# Patient Record
Sex: Male | Born: 1950 | Hispanic: No | Marital: Married | Smoking: Former smoker
Health system: Southern US, Community
[De-identification: ages and names within clinical notes are randomized; demographics above are authoritative.]

## PROBLEM LIST (undated history)

## (undated) DIAGNOSIS — I10 Essential (primary) hypertension: Secondary | ICD-10-CM

## (undated) DIAGNOSIS — E785 Hyperlipidemia, unspecified: Secondary | ICD-10-CM

## (undated) DIAGNOSIS — I639 Cerebral infarction, unspecified: Secondary | ICD-10-CM

## (undated) HISTORY — DX: Cerebral infarction, unspecified: I63.9

---

## 2002-03-31 ENCOUNTER — Emergency Department (HOSPITAL_COMMUNITY): Admission: EM | Admit: 2002-03-31 | Discharge: 2002-03-31 | Payer: Self-pay | Admitting: Emergency Medicine

## 2002-03-31 ENCOUNTER — Encounter: Payer: Self-pay | Admitting: Emergency Medicine

## 2005-07-09 ENCOUNTER — Emergency Department (HOSPITAL_COMMUNITY): Admission: EM | Admit: 2005-07-09 | Discharge: 2005-07-10 | Payer: Self-pay | Admitting: Emergency Medicine
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
## 2018-03-19 ENCOUNTER — Observation Stay (HOSPITAL_BASED_OUTPATIENT_CLINIC_OR_DEPARTMENT_OTHER)
Admission: EM | Admit: 2018-03-19 | Discharge: 2018-03-20 | Disposition: A | Discharge status: Home / Self Care | Payer: Medicare Other | Attending: Internal Medicine | Admitting: Internal Medicine

## 2018-03-19 ENCOUNTER — Other Ambulatory Visit: Payer: Self-pay

## 2018-03-19 ENCOUNTER — Emergency Department (HOSPITAL_BASED_OUTPATIENT_CLINIC_OR_DEPARTMENT_OTHER): Payer: Medicare Other

## 2018-03-19 ENCOUNTER — Encounter (HOSPITAL_BASED_OUTPATIENT_CLINIC_OR_DEPARTMENT_OTHER): Payer: Self-pay | Admitting: *Deleted

## 2018-03-19 DIAGNOSIS — I10 Essential (primary) hypertension: Secondary | ICD-10-CM | POA: Diagnosis present

## 2018-03-19 DIAGNOSIS — Z79899 Other long term (current) drug therapy: Secondary | ICD-10-CM | POA: Insufficient documentation

## 2018-03-19 DIAGNOSIS — Z7901 Long term (current) use of anticoagulants: Secondary | ICD-10-CM | POA: Insufficient documentation

## 2018-03-19 DIAGNOSIS — Z9119 Patient's noncompliance with other medical treatment and regimen: Secondary | ICD-10-CM | POA: Insufficient documentation

## 2018-03-19 DIAGNOSIS — Z7982 Long term (current) use of aspirin: Secondary | ICD-10-CM | POA: Insufficient documentation

## 2018-03-19 DIAGNOSIS — G459 Transient cerebral ischemic attack, unspecified: Principal | ICD-10-CM | POA: Diagnosis present

## 2018-03-19 DIAGNOSIS — E785 Hyperlipidemia, unspecified: Secondary | ICD-10-CM | POA: Diagnosis not present

## 2018-03-19 DIAGNOSIS — R200 Anesthesia of skin: Secondary | ICD-10-CM

## 2018-03-19 DIAGNOSIS — F17200 Nicotine dependence, unspecified, uncomplicated: Secondary | ICD-10-CM

## 2018-03-19 DIAGNOSIS — IMO0001 Reserved for inherently not codable concepts without codable children: Secondary | ICD-10-CM | POA: Diagnosis present

## 2018-03-19 HISTORY — DX: Hyperlipidemia, unspecified: E78.5

## 2018-03-19 HISTORY — DX: Essential (primary) hypertension: I10

## 2018-03-19 LAB — DIFFERENTIAL
Abs Immature Granulocytes: 0.07 10*3/uL (ref 0.00–0.07)
BASOS PCT: 1 %
Basophils Absolute: 0.1 10*3/uL (ref 0.0–0.1)
Eosinophils Absolute: 0.2 10*3/uL (ref 0.0–0.5)
Eosinophils Relative: 2 %
Immature Granulocytes: 1 %
Lymphocytes Relative: 36 %
Lymphs Abs: 2.7 10*3/uL (ref 0.7–4.0)
Monocytes Absolute: 0.6 10*3/uL (ref 0.1–1.0)
Monocytes Relative: 7 %
NEUTROS PCT: 53 %
Neutro Abs: 4 10*3/uL (ref 1.7–7.7)

## 2018-03-19 LAB — COMPREHENSIVE METABOLIC PANEL
ALT: 32 U/L (ref 0–44)
AST: 24 U/L (ref 15–41)
Albumin: 4.3 g/dL (ref 3.5–5.0)
Alkaline Phosphatase: 80 U/L (ref 38–126)
Anion gap: 6 (ref 5–15)
BUN: 13 mg/dL (ref 8–23)
CO2: 28 mmol/L (ref 22–32)
Calcium: 9 mg/dL (ref 8.9–10.3)
Chloride: 107 mmol/L (ref 98–111)
Creatinine, Ser: 0.9 mg/dL (ref 0.61–1.24)
GFR calc Af Amer: >60 mL/min (ref >60)
GFR calc non Af Amer: >60 mL/min (ref >60)
Glucose, Bld: 127 mg/dL — ABNORMAL HIGH (ref 70–99)
Potassium: 4.9 mmol/L (ref 3.5–5.1)
Sodium: 141 mmol/L (ref 135–145)
Total Bilirubin: 0.4 mg/dL (ref 0.3–1.2)
Total Protein: 7.4 g/dL (ref 6.5–8.1)

## 2018-03-19 LAB — RAPID URINE DRUG SCREEN, HOSP PERFORMED
Amphetamines: NOT DETECTED
Barbiturates: NOT DETECTED
Benzodiazepines: NOT DETECTED
Cocaine: NOT DETECTED
Opiates: NOT DETECTED
Tetrahydrocannabinol: NOT DETECTED

## 2018-03-19 LAB — APTT: APTT: 28 s (ref 24–36)

## 2018-03-19 LAB — URINALYSIS, ROUTINE W REFLEX MICROSCOPIC
Bilirubin Urine: NEGATIVE
Glucose, UA: NEGATIVE mg/dL
Hgb urine dipstick: NEGATIVE
Ketones, ur: NEGATIVE mg/dL
Leukocytes, UA: NEGATIVE
NITRITE: NEGATIVE
PH: 6.5 (ref 5.0–8.0)
Protein, ur: NEGATIVE mg/dL
Specific Gravity, Urine: 1.02 (ref 1.005–1.030)

## 2018-03-19 LAB — CBC
HCT: 48.9 % (ref 39.0–52.0)
Hemoglobin: 15.9 g/dL (ref 13.0–17.0)
MCH: 31.5 pg (ref 26.0–34.0)
MCHC: 32.5 g/dL (ref 30.0–36.0)
MCV: 96.8 fL (ref 80.0–100.0)
Platelets: 241 10*3/uL (ref 150–400)
RBC: 5.05 MIL/uL (ref 4.22–5.81)
RDW: 12.8 % (ref 11.5–15.5)
WBC: 7.5 10*3/uL (ref 4.0–10.5)
nRBC: 0 % (ref 0.0–0.2)

## 2018-03-19 LAB — ETHANOL: Alcohol, Ethyl (B): <10 mg/dL (ref <10)

## 2018-03-19 LAB — PROTIME-INR
INR: 0.93
Prothrombin Time: 12.4 seconds (ref 11.4–15.2)

## 2018-03-19 LAB — TROPONIN I: Troponin I: <0.03 ng/mL (ref <0.03)

## 2018-03-19 MED ORDER — ACETAMINOPHEN 160 MG/5ML PO SOLN: 650.0000 mg | ORAL | Status: DC | PRN

## 2018-03-19 MED ORDER — ASPIRIN 81 MG PO CHEW
324.0000 mg | CHEWABLE_TABLET | Freq: Once | ORAL | Status: AC
  Administered 2018-03-19: 324 mg via ORAL
  Filled 2018-03-19: qty 4

## 2018-03-19 MED ORDER — ASPIRIN 300 MG RE SUPP: 300.0000 mg | Freq: Every day | RECTAL | Status: DC

## 2018-03-19 MED ORDER — ENOXAPARIN SODIUM 40 MG/0.4ML ~~LOC~~ SOLN
40.0000 mg | SUBCUTANEOUS | Status: DC
  Administered 2018-03-20: 40 mg via SUBCUTANEOUS
  Filled 2018-03-19: qty 0.4

## 2018-03-19 MED ORDER — ACETAMINOPHEN 325 MG PO TABS: 650.0000 mg | ORAL_TABLET | ORAL | Status: DC | PRN

## 2018-03-19 MED ORDER — STROKE: EARLY STAGES OF RECOVERY BOOK
Freq: Once | Status: AC
  Administered 2018-03-20: 01:00:00
  Filled 2018-03-19: qty 1

## 2018-03-19 MED ORDER — ACETAMINOPHEN 650 MG RE SUPP: 650.0000 mg | RECTAL | Status: DC | PRN

## 2018-03-19 MED ORDER — ASPIRIN 325 MG PO TABS
325.0000 mg | ORAL_TABLET | Freq: Every day | ORAL | Status: DC
  Administered 2018-03-20: 325 mg via ORAL
  Filled 2018-03-19: qty 1

## 2018-03-19 NOTE — H&P (Addendum)
History and Physical    Jordan Bradford VJK:[id number] DOB: 10-12-1950 DOA: 03/19/2018  PCP: Jordan Bradford  Patient coming from: Home  I have personally briefly reviewed patient's old medical records in [facility]  Chief Complaint: L arm and face numbness  HPI: Jordan Bradford is a 68 y.o. male with medical history significant of HTN, HLD, smoking, isnt really compliant with medication Bradford daughter.  Patient presents to ED at [facility] with onset at ~10pm last night of L sided facial, LUE, and LLE numbness.  Symptoms persistent since that time.  No prior h/o Stroke/TIA.  No recent head injury.  Patient speaks some english, family at bedside translating.   ED Course: CT head neg.  Tele-neuro consult recd admission for stroke work up.   Review of Systems: As Bradford HPI otherwise 10 point review of systems negative.   Past Medical History:  Diagnosis Date  . HLD (hyperlipidemia)   . Hypertension     History reviewed. No pertinent surgical history.   reports that he has been smoking. He has never used smokeless tobacco. He reports that he does not drink alcohol or use drugs.  No Known Allergies  Family History  Problem Relation Age of Onset  . Stroke Neg Hx      Prior to Admission medications   Not on File    Physical Exam: Vitals:   03/19/18 2030 03/19/18 2100 03/19/18 2130 03/19/18 2224  BP: (!) 186/104 (!) 190/110 (!) 196/106 (!) 192/107  Pulse: 82 81 82 80  Resp: 17 13 17 18   Temp:    98.4 F (36.9 C)  TempSrc:    Oral  SpO2: 99% 99% 98% 98%  Weight:      Height:        Constitutional: NAD, calm, comfortable Eyes: PERRL, lids and conjunctivae normal ENMT: Mucous membranes are moist. Posterior pharynx clear of any exudate or lesions.Normal dentition.  Neck: normal, supple, no masses, no thyromegaly Respiratory: clear to auscultation bilaterally, no wheezing, no crackles. Normal respiratory effort. No accessory muscle use.  Cardiovascular: Regular rate and  rhythm, no murmurs / rubs / gallops. No extremity edema. 2+ pedal pulses. No carotid bruits.  Abdomen: no tenderness, no masses palpated. No hepatosplenomegaly. Bowel sounds positive.  Musculoskeletal: no clubbing / cyanosis. No joint deformity upper and lower extremities. Good ROM, no contractures. Normal muscle tone.  Skin: no rashes, lesions, ulcers. No induration Neurologic: Minimal L sided pronator drift, reports abnormal sensation to L side. Psychiatric: Normal judgment and insight. Alert and oriented x 3. Normal mood.    Labs on Admission: I have personally reviewed following labs and imaging studies  CBC: Recent Labs  Lab 03/19/18 1709  WBC 7.5  NEUTROABS 4.0  HGB 15.9  HCT 48.9  MCV 96.8  PLT 241   Basic Metabolic Panel: Recent Labs  Lab 03/19/18 1709  NA 141  K 4.9  CL 107  CO2 28  GLUCOSE 127*  BUN 13  CREATININE 0.90  CALCIUM 9.0   GFR: Estimated Creatinine Clearance: 75.2 mL/min (by C-G formula based on SCr of 0.9 mg/dL). Liver Function Tests: Recent Labs  Lab 03/19/18 1709  AST 24  ALT 32  ALKPHOS 80  BILITOT 0.4  PROT 7.4  ALBUMIN 4.3   No results for input(s): LIPASE, AMYLASE in the last 168 hours. No results for input(s): AMMONIA in the last 168 hours. Coagulation Profile: Recent Labs  Lab 03/19/18 1709  INR 0.93   Cardiac Enzymes: Recent Labs  Lab  03/19/18 1709  TROPONINI <0.03   BNP (last 3 results) No results for input(s): PROBNP in the last 8760 hours. HbA1C: No results for input(s): HGBA1C in the last 72 hours. CBG: No results for input(s): GLUCAP in the last 168 hours. Lipid Profile: No results for input(s): CHOL, HDL, LDLCALC, TRIG, CHOLHDL, LDLDIRECT in the last 72 hours. Thyroid Function Tests: No results for input(s): TSH, T4TOTAL, FREET4, T3FREE, THYROIDAB in the last 72 hours. Anemia Panel: No results for input(s): VITAMINB12, FOLATE, FERRITIN, TIBC, IRON, RETICCTPCT in the last 72 hours. Urine analysis:      Component Value Date/Time   COLORURINE YELLOW 03/19/2018 1709   APPEARANCEUR CLEAR 03/19/2018 1709   LABSPEC 1.020 03/19/2018 1709   PHURINE 6.5 03/19/2018 1709   GLUCOSEU NEGATIVE 03/19/2018 1709   HGBUR NEGATIVE 03/19/2018 1709   BILIRUBINUR NEGATIVE 03/19/2018 1709   KETONESUR NEGATIVE 03/19/2018 1709   PROTEINUR NEGATIVE 03/19/2018 1709   NITRITE NEGATIVE 03/19/2018 1709   LEUKOCYTESUR NEGATIVE 03/19/2018 1709    Radiological Exams on Admission: Ct Head Wo Contrast  Result Date: 03/19/2018 CLINICAL DATA:  Numbness and tingling on the left which is chronic but worsening. EXAM: CT HEAD WITHOUT CONTRAST TECHNIQUE: Contiguous axial images were obtained from the base of the skull through the vertex without intravenous contrast. COMPARISON:  None. FINDINGS: Brain: The brain shows a normal appearance without evidence of malformation, atrophy, old or acute small or large vessel infarction, mass lesion, hemorrhage, hydrocephalus or extra-axial collection. Vascular: There is atherosclerotic calcification of the major vessels at the base of the brain. Skull: Normal.  No traumatic finding.  No focal bone lesion. Sinuses/Orbits: Sinuses are clear. Orbits appear normal. Mastoids are clear. Other: None significant IMPRESSION: Normal exam for age. Normal appearance of the brain itself. Some atherosclerotic calcification of the major vessels at the base of the brain. Electronically Signed   By: Paulina FusiMark  Shogry M.D.   On: 03/19/2018 17:17    EKG: Independently reviewed.  Assessment/Plan Principal Problem:   TIA (transient ischemic attack) Active Problems:   HTN (hypertension)   HLD (hyperlipidemia)   Smoking    1. TIA - 1. Stroke pathway and work up 2. MRI / MRA 3. 2d echo 4. Carotid dopplers 5. FLP / A1C 6. ASA 325 daily for now 7. PT/OT/SLP 8. Neuro consult if MRI positive for stroke. 2. HTN - 1. Not currently on any meds at home 2. MRI pending 1. if neg then would start treatment with  HTN meds 2. if positive then needs permissive HTN 3. HLD - 1. Not currently on any home meds 2. FLP in AM 4. Smoking - 1. Advised to quit 2. Doesn't want pharm therapy to help at the moment Bradford family, though they are going to discuss this further with him after hospital stay. 5. Report of intermittent CP - no CP today 1. EKG noted to have TWI in III, AVF, and V6 2. Trop neg and asymptomatic from CP standpoint today 3. Likely needs cards f/u outpt as I discussed with family.  Doubt they would do inpatient intervention right now given neg trop, asymptomatic patient. 4. But definitely needs medical therapy / risk factor modification of HTN, HLD, and smoking, regardless.  DVT prophylaxis: Lovenox Code Status: Full Family Communication: Family at bedside, daughter seems very medically oriented Disposition Plan: Home after admit Consults called: None Admission status: Place in 43obs - convert to IP if stroke confirmed    Hillary BowGARDNER, JARED M. DO [facility] Pager [phone] Only works nights!  If 7AM-7PM, please contact the primary day team physician taking care of patient  [email or web address] Password [email or web address]  03/19/2018, 11:24 PM

## 2018-03-19 NOTE — ED Notes (Signed)
[facility] notified (Kim) - Patient ready for transport 

## 2018-03-19 NOTE — ED Triage Notes (Signed)
Numbness to his left face, arm and leg since last night.

## 2018-03-19 NOTE — ED Notes (Signed)
Teleneuro at the bedside. 

## 2018-03-19 NOTE — Progress Notes (Signed)
Pt arrived to the unit via stretcher by [facility]. Pt alert and verbally responsive; pt in bed with family at bedside; MD notified of pt's arrival to the unit. Pt oriented to the unit and room; call light within reach. Dionne Bucy RN

## 2018-03-19 NOTE — Consult Note (Addendum)
TeleNeurology Consult Note  Edythe Clarity     Consulting Physician: Sharalyn Ink Code Status: No Order Primary Care Physician:  No primary care provider on file.  DOB:  1950-03-10   Age: 68 y.o.  Date of Service: March 19, 2018 Admit Date:  03/19/2018   Admitting Diagnosis: <principal problem not specified>  Subjective:  Reason for Consultation: numbness since 2200  Chief Complaint: numbness since 2200  History of present illness: 68 y/o man with h/o HTN and hyperlipdemia who presents with left sided numbness since last night at 2200. CT head and reviewed. Patient's nephew at the bedside. STAT teleneurology consult requested. Case discussed with ED staff. Patient's nephew assisted in translating. All questions answered.   Review of Systems  There are no active problems to display for this patient.  Past Medical History:  Diagnosis Date  . Hypertension    History reviewed. No pertinent surgical history. No Known Allergies  Social History   Socioeconomic History  . Marital status: Married    Spouse name: Not on file  . Number of children: Not on file  . Years of education: Not on file  . Highest education level: Not on file  Occupational History  . Not on file  Social Needs  . Financial resource strain: Not on file  . Food insecurity:    Worry: Not on file    Inability: Not on file  . Transportation needs:    Medical: Not on file    Non-medical: Not on file  Tobacco Use  . Smoking status: Former Games developer  . Smokeless tobacco: Never Used  Substance and Sexual Activity  . Alcohol use: Never    Frequency: Never  . Drug use: Never  . Sexual activity: Not on file  Lifestyle  . Physical activity:    Days per week: Not on file    Minutes per session: Not on file  . Stress: Not on file  Relationships  . Social connections:    Talks on phone: Not on file    Gets together: Not on file    Attends religious service: Not on file    Active member of club or  organization: Not on file    Attends meetings of clubs or organizations: Not on file    Relationship status: Not on file  . Intimate partner violence:    Fear of current or ex partner: Not on file    Emotionally abused: Not on file    Physically abused: Not on file    Forced sexual activity: Not on file  Other Topics Concern  . Not on file  Social History Narrative  . Not on file   Family History History reviewed. No pertinent family history.    Objective:  Vital signs in last 24 hours: Temp:  [98.1 F (36.7 C)] 98.1 F (36.7 C) (01/24 1636) Pulse Rate:  [83-96] 83 (01/24 1730) Resp:  [14-21] 21 (01/24 1730) BP: (192-212)/(99-111) 192/110 (01/24 1730) SpO2:  [98 %-99 %] 98 % (01/24 1730) Weight:  [72.6 kg] 72.6 kg (01/24 1634) Temp (24hrs), Avg:98.1 F (36.7 C), Min:98.1 F (36.7 C), Max:98.1 F (36.7 C)    Intake/Output last 3 shifts: No intake/output data recorded.  Intake/Output this shift: No intake/output data recorded.  Physical Exam 1a- LOC: Keenly responsive - 0      1b- LOC questions: Answers both questions correctly - 0     1c- LOC commands- Performs both tasks correctly- 0     2- Gaze: Normal; no  gaze paresis or gaze deviation - 0     3- Visual Fields: normal, no Visual field deficit - 0     4- Facial movements: no facial palsy - 0     5a- Right Upper limb motor - no drift -0     5b -Left Upper limb motor - no drift -0     6a- Right Lower limb motor - no drift - 0      6b- Left Lower limb motor - no drift - 0 7- Limb Coordination: absent ataxia - 0      8- Sensory : no sensory loss - 0      9- Language - No aphasia - 0      10- Speech - No dysarthria -0     11- Neglect / Extinction - none found -0     NIHSS score   0    Diagnostic Findings:  Pertinent Labs:  Recent Labs  Lab 03/19/18 1709  WBC 7.5  MCV 96.8   Recent Labs  Lab 03/19/18 1709  CALCIUM 9.0  BUN 13  GLUCOSE 127*  CREATININE 0.90  CO2 28   Recent Labs  Lab  03/19/18 1709  AST 24  ALT 32  ALBUMIN 4.3   No results for input(s): TRIG in the last 168 hours.  Invalid input(s): CHLPL, HDL PML, VLDL CALC, LDL CALC, CHOL/HDL RATIO, LDL/HDL RATIO, NON-HDL CHOLESTEROL Invalid input(s): CK TOTAL, TROPONIN I, CK MB Recent Labs  Lab 03/19/18 1709  APTT 28   Recent Labs  Lab 03/19/18 1709  INR 0.93    Extensive review of previous medical records completed.    Assessment: There are no active problems to display for this patient.    Plan: [facility] TeleNeurology Consult Services  Impression: TIA - r/o Stroke     Differential Diagnosis:  1. Cardioembolic stroke  2. Small vessel disease/lacune    3. Thromboembolic, artery-to-artery mechanism 4. Hypercoagulable state-related infarct  5. Thrombotic mechanism, large artery disease 6. Transient ischemic attack  Comments: Last known well:   2200 Door time:1640 [facility] contacted:   1804 [facility] at bedside:   STAT NIHSS assessment time:1825     Discussion:     Our recommendations are outlined below.  Recommendations: ASA, IV fluids and permissive hypertension (Keep SBP < 220/110) Neurochecks DVT prophylaxis    Follow up with Neurology for further testing and evaluation  Medical Decision Making:  - Extensive number of diagnosis or management options are considered above. - Extensive amount of complex data reviewed.  - High risk of complication and/or morbidity or mortality are associated with differential diagnostic considerations above. - There may be uncertain outcome and increased probability of prolonged functional impairment or high probability of severe prolonged functional impairment associated with some of these differential diagnosis.  Medical Data Reviewed: 1.Data reviewed include clinical labs, radiology, Medical Tests; 2.Tests results discussed w/performing or interpreting physician;    3.Obtaining/reviewing old medical records; 4.Obtaining  case history from another source;    5.Independent review of image, tracing or specimen.  Signed: @MECRED @   03/19/2018  6:26 PM

## 2018-03-19 NOTE — ED Notes (Signed)
Pt on monitor 

## 2018-03-19 NOTE — ED Provider Notes (Signed)
[facility] EMERGENCY DEPARTMENT Provider Note   CSN: [id number] Arrival date & time: 03/19/18  1630     History   Chief Complaint Chief Complaint  Patient presents with  . Numbness    HPI Jordan Bradford is a 68 y.o. male.  With a history of hypertension and hyperlipidemia who presents to the emergency department for left-sided numbness that started at 10 PM last evening.  Patient states that he was at rest when he developed a numbness sensation to the left side of his face, left upper extremity, and left lower extremity.  He states that the left side of his body feels somewhat heavy/weak.  There are no specific alleviating or aggravating factors to the symptoms.  No intervention tried prior to arrival.  He has not noted any change in his vision including no double vision or visual field loss.  He denies headache, facial droop, difficulty with speech/slurred speech, chest pain, or dyspnea.  No prior history of stroke/TIA.  Denies recent head injury.  Patient is able to speak [place], his nephew at bedside does assist somewhat with translation, formal translator was declined.  HPI  Past Medical History:  Diagnosis Date  . Hypertension     There are no active problems to display for this patient.   History reviewed. No pertinent surgical history.      Home Medications    Prior to Admission medications   Not on File    Family History History reviewed. No pertinent family history.  Social History Social History   Tobacco Use  . Smoking status: Former Games developer  . Smokeless tobacco: Never Used  Substance Use Topics  . Alcohol use: Never    Frequency: Never  . Drug use: Never     Allergies   Patient has no known allergies.   Review of Systems Review of Systems  Constitutional: Negative for chills and fever.  Eyes: Negative for visual disturbance.  Respiratory: Negative for shortness of breath.   Cardiovascular: Negative for chest pain.  Neurological:  Positive for weakness and numbness. Negative for dizziness, syncope, speech difficulty and headaches.  All other systems reviewed and are negative.    Physical Exam Updated Vital Signs Pulse 96   Temp 98.1 F (36.7 C) (Oral)   Resp 18   Ht 5' 6.5" (1.689 m)   Wt 72.6 kg   SpO2 99%   BMI 25.44 kg/m   Physical Exam Vitals signs and nursing note reviewed.  Constitutional:      General: He is not in acute distress.    Appearance: He is well-developed. He is not toxic-appearing.  HENT:     Head: Normocephalic and atraumatic.  Eyes:     General:        Right eye: No discharge.        Left eye: No discharge.     Extraocular Movements: Extraocular movements intact.     Conjunctiva/sclera: Conjunctivae normal.     Pupils: Pupils are equal, round, and reactive to light.  Neck:     Musculoskeletal: Neck supple.  Cardiovascular:     Rate and Rhythm: Normal rate and regular rhythm.  Pulmonary:     Effort: Pulmonary effort is normal. No respiratory distress.     Breath sounds: Normal breath sounds. No wheezing, rhonchi or rales.  Abdominal:     General: There is no distension.     Palpations: Abdomen is soft.     Tenderness: There is no abdominal tenderness.  Skin:  General: Skin is warm and dry.     Findings: No rash.  Neurological:     Mental Status: He is alert.     Comments: Clear speech.  CN III through XII grossly intact with the exception of some decreased L sided facial sensation. reports some abnormal sensation to the left side of his face, left upper extremity and left lower extremity in comparison to the right, sensation intact to sharp/dull touch throughout.  He has very minimal left-sided pronator drift noted.  5 out of 5 symmetric grip strength.  5 out of 5 strength with plantar and dorsiflexion bilaterally.  He is ambulatory.  Negative Romberg.  Normal finger-to-nose bilaterally.  Normal heel-to-shin bilaterally.  Psychiatric:        Behavior: Behavior normal.       ED Treatments / Results  Labs (all labs ordered are listed, but only abnormal results are displayed) Labs Reviewed  COMPREHENSIVE METABOLIC PANEL - Abnormal; Notable for the following components:      Result Value   Glucose, Bld 127 (*)    All other components within normal limits  ETHANOL  PROTIME-INR  APTT  CBC  DIFFERENTIAL  TROPONIN I  RAPID URINE DRUG SCREEN, HOSP PERFORMED  URINALYSIS, ROUTINE W REFLEX MICROSCOPIC    EKG EKG Interpretation  Date/Time:  Friday March 19 2018 16:46:41 EST Ventricular Rate:  93 PR Interval:    QRS Duration: 115 QT Interval:  368 QTC Calculation: 458 R Axis:   96 Text Interpretation:  Sinus rhythm Consider left ventricular hypertrophy Nonspecific T abnormalities, inferior leads No old tracing to compare Confirmed by Pricilla LovelessGoldston, Scott [phone]([id number]) on 03/19/2018 5:05:06 PM   Radiology Ct Head Wo Contrast  Result Date: 03/19/2018 CLINICAL DATA:  Numbness and tingling on the left which is chronic but worsening. EXAM: CT HEAD WITHOUT CONTRAST TECHNIQUE: Contiguous axial images were obtained from the base of the skull through the vertex without intravenous contrast. COMPARISON:  None. FINDINGS: Brain: The brain shows a normal appearance without evidence of malformation, atrophy, old or acute small or large vessel infarction, mass lesion, hemorrhage, hydrocephalus or extra-axial collection. Vascular: There is atherosclerotic calcification of the major vessels at the base of the brain. Skull: Normal.  No traumatic finding.  No focal bone lesion. Sinuses/Orbits: Sinuses are clear. Orbits appear normal. Mastoids are clear. Other: None significant IMPRESSION: Normal exam for age. Normal appearance of the brain itself. Some atherosclerotic calcification of the major vessels at the base of the brain. Electronically Signed   By: Paulina FusiMark  Shogry M.D.   On: 03/19/2018 17:17    Procedures Procedures (including critical care time)  Medications Ordered in  ED Medications - No data to display   Initial Impression / Assessment and Plan / ED Course  I have reviewed the triage vital signs and the nursing notes.  Pertinent labs & imaging results that were available during my care of the patient were reviewed by me and considered in my medical decision making (see chart for details).   Patient presents to the emergency department with complaints of left-sided numbness/weakness.  Nontoxic-appearing, notably hypertensive in the emergency department.  His neurologic exam is notable for active decreased sensation to the left side.  He has very minimal pronator drift noted on the left.  He has symmetric strength throughout.  He is ambulatory.  No code stroke initiated given onset of sxs >4.5 hours and VAN negative for LVO.  We will proceed with labs and CT head without contrast.  Work-up reviewed:  CT head: no acute abnormalities.  Labs WNL with the exception of hyperglycemia at 127.  EKG: Nonspecific T wave changes.   Plan for tele-neurology consultation, anticipate admission.   18:30: CONSULT: Discussed with Dr. Duffy RhodyStanley Leu, tele-neurologist who has evaluated patient- recommends administration of aspirin & admission for MRI and further stroke work up.   Consult placed to hospitalist service.   19:20: CONSULT: Supervising physician Dr. Criss AlvineGoldston spoke with tried hospitalist Dr. Toniann FailKakrakandy who accepts patient for admission.  Findings and plan of care discussed with supervising physician Dr. Criss AlvineGoldston who personally evaluated patient & provided guidance in management & plan for admission.   Final Clinical Impressions(s) / ED Diagnoses   Final diagnoses:  Left sided numbness    ED Discharge Orders    None       Cherly Andersonetrucelli, Raygen Linquist R, PA-C 03/19/18 Vonzell Schlatter1921    Goldston, Scott, MD 03/19/18 [phone]

## 2018-03-20 ENCOUNTER — Observation Stay (HOSPITAL_BASED_OUTPATIENT_CLINIC_OR_DEPARTMENT_OTHER): Payer: Medicare Other

## 2018-03-20 ENCOUNTER — Observation Stay (HOSPITAL_COMMUNITY): Payer: Medicare Other

## 2018-03-20 DIAGNOSIS — G459 Transient cerebral ischemic attack, unspecified: Principal | ICD-10-CM

## 2018-03-20 DIAGNOSIS — E785 Hyperlipidemia, unspecified: Secondary | ICD-10-CM | POA: Diagnosis not present

## 2018-03-20 DIAGNOSIS — I10 Essential (primary) hypertension: Secondary | ICD-10-CM | POA: Diagnosis not present

## 2018-03-20 DIAGNOSIS — R200 Anesthesia of skin: Secondary | ICD-10-CM | POA: Diagnosis not present

## 2018-03-20 LAB — LIPID PANEL
Cholesterol: 223 mg/dL — ABNORMAL HIGH (ref 0–200)
HDL: 32 mg/dL — ABNORMAL LOW (ref >40)
LDL Cholesterol: UNDETERMINED mg/dL (ref 0–99)
Total CHOL/HDL Ratio: 7 RATIO
Triglycerides: 480 mg/dL — ABNORMAL HIGH (ref <150)
VLDL: UNDETERMINED mg/dL (ref 0–40)

## 2018-03-20 LAB — ECHOCARDIOGRAM COMPLETE
HEIGHTINCHES: 66.5 in
Weight: 2560 oz

## 2018-03-20 LAB — HEMOGLOBIN A1C
HEMOGLOBIN A1C: 6.4 % — AB (ref 4.8–5.6)
Mean Plasma Glucose: 136.98 mg/dL

## 2018-03-20 LAB — HIV ANTIBODY (ROUTINE TESTING W REFLEX): HIV Screen 4th Generation wRfx: NONREACTIVE

## 2018-03-20 MED ORDER — CLOPIDOGREL BISULFATE 75 MG PO TABS: 75.0000 mg | ORAL_TABLET | Freq: Every day | ORAL | 0 refills | Status: DC

## 2018-03-20 MED ORDER — LOSARTAN POTASSIUM 50 MG PO TABS: 100.0000 mg | ORAL_TABLET | Freq: Every day | ORAL | Status: DC

## 2018-03-20 MED ORDER — AMLODIPINE BESYLATE 10 MG PO TABS: 10.0000 mg | ORAL_TABLET | Freq: Every day | ORAL | 0 refills | Status: DC

## 2018-03-20 MED ORDER — LOSARTAN POTASSIUM 100 MG PO TABS: 100.0000 mg | ORAL_TABLET | Freq: Every day | ORAL | 0 refills | Status: DC

## 2018-03-20 MED ORDER — ATORVASTATIN CALCIUM 80 MG PO TABS
80.0000 mg | ORAL_TABLET | Freq: Every day | ORAL | Status: DC
  Administered 2018-03-20: 80 mg via ORAL
  Filled 2018-03-20: qty 1

## 2018-03-20 MED ORDER — ATORVASTATIN CALCIUM 80 MG PO TABS: 80.0000 mg | ORAL_TABLET | Freq: Every day | ORAL | 0 refills | Status: DC

## 2018-03-20 MED ORDER — ASPIRIN EC 81 MG PO TBEC: 81.0000 mg | DELAYED_RELEASE_TABLET | Freq: Every day | ORAL | Status: DC

## 2018-03-20 MED ORDER — NICOTINE 21 MG/24HR TD PT24: 21.0000 mg | MEDICATED_PATCH | TRANSDERMAL | 1 refills | Status: DC

## 2018-03-20 MED ORDER — ASPIRIN 81 MG PO TBEC: 81.0000 mg | DELAYED_RELEASE_TABLET | Freq: Every day | ORAL | 0 refills | Status: DC

## 2018-03-20 MED ORDER — CLOPIDOGREL BISULFATE 75 MG PO TABS
75.0000 mg | ORAL_TABLET | Freq: Every day | ORAL | Status: DC
  Administered 2018-03-20: 75 mg via ORAL
  Filled 2018-03-20: qty 1

## 2018-03-20 MED ORDER — HYDROCHLOROTHIAZIDE 25 MG PO TABS
25.0000 mg | ORAL_TABLET | Freq: Every day | ORAL | Status: DC
  Administered 2018-03-20: 25 mg via ORAL
  Filled 2018-03-20: qty 1

## 2018-03-20 MED ORDER — AMLODIPINE BESYLATE 10 MG PO TABS
10.0000 mg | ORAL_TABLET | Freq: Every day | ORAL | Status: DC
  Administered 2018-03-20: 10 mg via ORAL
  Filled 2018-03-20: qty 1

## 2018-03-20 NOTE — Consult Note (Signed)
Stroke neurology consult note  Referring Physician: Dr Benjamine Mola    Reason for Consult: Left arm numbness  HPI: Jordan Bradford is a 68 y.o. male with a history of hypertension, hyperlipidemia, ongoing tobacco use (50 pk yr hx), and medical noncompliance who presented to the [facility] in [facility] last night with a complaint of left-sided numbness.  He was seen in consultation by tele-neurology who recommended admission for further evaluation.  The history today was obtained from the patient along with his wife and niece (a Teacher, early years/pre). The niece acted as an Equities trader for the majority of the history.  Since admission the patient has had an MRI as well as a CT scan of the brain both of which were negative for infarct.  Most of the numbness has resolved except for mild numbness of his left face and left hand.   Prior to admission the patient was supposed to be on amlodipine 5 mg daily, losartan 100 mg daily, and Lipitor 20 mg daily; however, he was not taking any of these medications.  He was not on antiplatelet therapy. There is no family history of strokes.  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  Approximately 3 years ago he started having recurrent numbness of his left arm. He had some numbness of his left arm last week.  He has occasional neck pain around the C3-C5 area and uses topical OTC medications. This has never been evaluated. His chest x-ray this admission showed a mild, age-indeterminate midthoracic spine compression deformity.   Date last known well: Date: 03/19/2018 Time last known well: Time: 22:00  tPA Given: no   Past Medical History Past Medical History:  Diagnosis Date  . HLD (hyperlipidemia)   . Hypertension     Surgical History History reviewed. No pertinent surgical history.  Family History  Family History  Problem Relation Age of Onset  . Stroke Neg Hx     Social History:   reports that he has been smoking. He has never used  smokeless tobacco. He reports that he does not drink alcohol or use drugs.  Allergies:  No Known Allergies  Home Medications:  No medications prior to admission.    Hospital Medications . aspirin  300 mg Rectal Daily   Or  . aspirin  325 mg Oral Daily  . atorvastatin  80 mg Oral q1800  . enoxaparin (LOVENOX) injection  40 mg Subcutaneous Q24H  . hydrochlorothiazide  25 mg Oral Daily    ROS:  History obtained from The patient, his wife, and his niece   General ROS: negative for - chills, fatigue, fever, night sweats, weight gain or weight loss Psychological ROS: negative for - behavioral disorder, hallucinations, memory difficulties, mood swings or suicidal ideation Ophthalmic ROS: negative for - blurry vision, double vision, eye pain or loss of vision ENT ROS: negative for - epistaxis, nasal discharge, oral lesions, sore throat, tinnitus or vertigo Allergy and Immunology ROS: negative for - hives or itchy/watery eyes Hematological and Lymphatic ROS: negative for - bleeding problems, bruising or swollen lymph nodes Endocrine ROS: negative for - galactorrhea, hair pattern changes, polydipsia/polyuria or temperature intolerance Respiratory ROS: negative for - cough, hemoptysis, shortness of breath or wheezing Cardiovascular ROS: negative for - chest pain, dyspnea on exertion, edema or irregular heartbeat Gastrointestinal ROS: Positive for occasional indigestion and heartburn. Genito-Urinary ROS: negative for - dysuria, hematuria, incontinence or urinary frequency/urgency Musculoskeletal ROS: Positive for bilateral knee pain and neck pain Neurological ROS: as  noted in HPI Dermatological ROS: negative for rash and skin lesion changes   Physical Examination:  Vitals:   03/20/18 0300 03/20/18 0500 03/20/18 0740 03/20/18 0903  BP: (!) 176/92 (!) 184/99 (!) 175/98 (!) 153/94  Pulse: 85 88 88 80  Resp: 20 18 18    Temp: 98 F (36.7 C) 97.9 F (36.6 C) 98.5 F (36.9 C)   TempSrc:  Oral Oral Oral   SpO2: 94% 95% 96%   Weight:      Height:        General -very pleasant well-developed, well-nourished and muscular 68 year old male Heart - Regular rate and rhythm - no murmer appreciated Lungs - Clear to auscultation  Abdomen - Soft - non tender Extremities - Distal pulses intact - no edema Skin - Warm and dry   Neurologic Examination:  Mental Status: Alert, oriented, thought content appropriate.  Speech fluent without evidence of aphasia. Able to follow 3 step commands without difficulty. Understands English fairly well.  Cranial Nerves: II: Discs not visualized; Visual fields grossly normal, pupils equal, round, reactive to light III,IV, VI: ptosis not present, extra-ocular motions intact bilaterally V,VII: smile symmetric, facial light touch sensation slightly decreased on the left. VIII: hearing normal bilaterally IX,X: gag reflex present XI: bilateral shoulder shrug XII: midline tongue extension Motor: RUE - 5/5    LUE - 5/5   RLE - 5/5    LLE - 5/5 Tone and bulk:normal tone throughout; no atrophy noted Sensory: Sensation to l ight touch slightly decreased in his left hand. Deep Tendon Reflexes: 2+ and symmetric throughout Plantars: Right: downgoing   Left: downgoing Cerebellar: normal finger-to-nose and normal heel-to-shin test Gait: deferred at this time.   ECG - (computer reading) Sinus rhythm rate 93 BPM. Consider left ventricular hypertrophy. Nonspecific T abnormalities, inferior leads. ST elevation, consider anterolateral injury.  LABORATORY STUDIES:  Basic Metabolic Panel: Recent Labs  Lab 03/19/18 1709  NA 141  K 4.9  CL 107  CO2 28  GLUCOSE 127*  BUN 13  CREATININE 0.90  CALCIUM 9.0    Liver Function Tests: Recent Labs  Lab 03/19/18 1709  AST 24  ALT 32  ALKPHOS 80  BILITOT 0.4  PROT 7.4  ALBUMIN 4.3   No results for input(s): LIPASE, AMYLASE in the last 168 hours. No results for input(s): AMMONIA in the last 168  hours.  CBC: Recent Labs  Lab 03/19/18 1709  WBC 7.5  NEUTROABS 4.0  HGB 15.9  HCT 48.9  MCV 96.8  PLT 241    Cardiac Enzymes: Recent Labs  Lab 03/19/18 1709  TROPONINI <0.03    BNP: Invalid input(s): POCBNP  CBG: No results for input(s): GLUCAP in the last 168 hours.  Microbiology:   Coagulation Studies: Recent Labs    03/19/18 1709  LABPROT 12.4  INR 0.93    Urinalysis:  Recent Labs  Lab 03/19/18 1709  COLORURINE YELLOW  LABSPEC 1.020  PHURINE 6.5  GLUCOSEU NEGATIVE  HGBUR NEGATIVE  BILIRUBINUR NEGATIVE  KETONESUR NEGATIVE  PROTEINUR NEGATIVE  NITRITE NEGATIVE  LEUKOCYTESUR NEGATIVE    Lipid Panel:     Component Value Date/Time   CHOL 223 (H) 03/20/2018 0614   TRIG 480 (H) 03/20/2018 0614   HDL 32 (L) 03/20/2018 0614   CHOLHDL 7.0 03/20/2018 0614   VLDL UNABLE TO CALCULATE IF TRIGLYCERIDE OVER 400 mg/dL 78/29/562101/25/2020 30860614   LDLCALC UNABLE TO CALCULATE IF TRIGLYCERIDE OVER 400 mg/dL 57/84/696201/25/2020 95280614    UXLK4MHgbA1C:  Lab Results  Component Value Date  HGBA1C 6.4 (H) 03/20/2018    Urine Drug Screen:      Component Value Date/Time   LABOPIA NONE DETECTED 03/19/2018 1709   COCAINSCRNUR NONE DETECTED 03/19/2018 1709   LABBENZ NONE DETECTED 03/19/2018 1709   AMPHETMU NONE DETECTED 03/19/2018 1709   THCU NONE DETECTED 03/19/2018 1709   LABBARB NONE DETECTED 03/19/2018 1709     Alcohol Level:  Recent Labs  Lab 03/19/18 1709  ETH <10      IMAGING:  Dg Chest 2 View 03/20/2018 IMPRESSION:  1. Pulmonary vascular congestion.  2. Chronic granulomatous disease  3. Mild, age-indeterminate midthoracic spine compression deformity.    Ct Head Wo Contrast 03/19/2018 IMPRESSION:  Normal exam for age. Normal appearance of the brain itself. Some atherosclerotic calcification of the major vessels at the base of the brain.    Mr Maxine GlennMra Head Wo Contrast 03/20/2018 IMPRESSION:  1. Normal brain.  2. 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     Vas [place] Carotid (at [facility]) 03/20/2018 Summary:  Right Carotid: Velocities in the right ICA are consistent with a 40-59% stenosis.  Left Carotid: Velocities in the left ICA are consistent with a 1-39% stenosis. Vertebrals:   Bilateral vertebral arteries demonstrate antegrade flow.  Subclavians: Normal flow hemodynamics were seen in bilateral subclavian arteries.  Preliminary     Transthoracic Echocardiogram - pending    Assessment: 68 y.o. 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 CT and MRI negative for stroke; although, MRA showed a severe proximal right PCA P2 segment stenosis. Carotid dopplers revealed 40-59% stenosis of the left ICA. The patient apparently has a long history of intermittent left arm numbness with a remote history of a fall from a ladder. Concerning for tiny stroke (not seen on MRI) vs TIA.   Stroke Risk Factors - Htn, Hld, tobacco use, non compliance with medications   Plan:  PT consult, OT consult, Speech consult - no F/U recommended  Echocardiogram - pending  Prophylactic therapy - Antiplatelet medication(s) - now on ASA 325 mg daily  Allow for permissive hypertension for the first 24-48h - only treat PRN if SBP >220 mmHg. Blood pressures can be gradually normalized to SBP<140 upon discharge  Statin Therapy - now on Lipitor 80 mg daily. (unable to calculate LDL - triglycerides 480)  Risk factor modification - pt needs to quit smoking.  Telemetry monitoring  Frequent neuro checks  DVT prophelaxis - on Lovenox  Hassel NethDavid Rinehuls PA-C [facility] Pager [phone]  [phone] 03/20/2018, 12:51 PM  ATTENDING NOTE: I reviewed above note and agree with the assessment and plan. Pt was seen and examined.   68 year old male with history of hypertension, hyperlipidemia, smoker admitted for left face, left arm and leg numbness.  CT no acute abnormality.  MRI showed no acute stroke.  However MRI showed severe right P2 segmental high-grade stenosis.  Carotid Doppler left ICA 40 to 59% stenosis.  2D echo pending.  Patient symptoms of left-sided numbness fit very well with right P2 high-grade stenosis.  Total cholesterol 223, TG 480 and LDL not able to calculate due to high TG.  A1c 6.4.  UDS negative.  On examination, awake alert, AAO x3, cranial nerves  intact except mild left facial decreased incision, about 85% of the right.  Moving all extremities symmetrically, sensation intact except left fingertips decreased sensation, 85% of the right.  Coordination intact.  DTR 1+, no Babinski.  Patient seems to still have residual very mild left facial and left hand numbness, could be due to a negative tiny small right thalamic infarct not been seen on MRI.  Risk factor including hypertension, hyperlipidemia, smoking and medication noncompliance.  Smoking cessation education provided.  Recommend DAPT with aspirin 81 and Plavix 75 for 3 weeks and then aspirin alone.  Continue Lipitor 80 on discharge.  Risk factor modification and lifestyle changes.  I also discussed with his niece over the phone. Neurology will sign off. Please call with questions. Pt will follow up with stroke clinic NP at [facility] in about 4 weeks. Thanks for the consult.   Marvel Plan, MD PhD Stroke Neurology 03/20/2018 1:41 PM

## 2018-03-20 NOTE — Progress Notes (Signed)
PT Cancellation Note and Discharge  Patient Details Name: Jordan Bradford MRN: [id number] DOB: 02/21/1951   Cancelled Treatment:    Reason Eval/Treat Not Completed: PT screened, no needs identified, will sign off. Observed pt ambulating in hall with OT. Discussed pt case with OT as well, who states pt is independent with mobility and does not require an additional PT evaluation at this time. If needs change, please reconsult.    Marylynn Pearson 03/20/2018, 9:56 AM   Conni Slipper, PT, DPT Acute Rehabilitation Services Pager: [phone] Office: [phone]

## 2018-03-20 NOTE — Evaluation (Signed)
Occupational Therapy Evaluation Patient Details Name: Jordan Bradford MRN: [id number] DOB: 05-02-1950 Today's Date: 03/20/2018    History of Present Illness This 68 y.o. male presented with Lt sided facial, Lt UE, and Lt LE nunbness.  MRI negative for acute infarct.  PMH includes:  +smoker,  HTN,    Clinical Impression   Patient evaluated by Occupational Therapy with no further acute OT needs identified. All education has been completed and the patient has no further questions. Pt appears back to baseline with exception of report of intermittent numbness of face and very mildly of Lt hand, but does not impact him functionally.  He is independent with ADLs and functional mobility.   See below for any follow-up Occupational Therapy or equipment needs. OT is signing off. Thank you for this referral.      Follow Up Recommendations  No OT follow up    Equipment Recommendations  None recommended by OT    Recommendations for Other Services       Precautions / Restrictions Precautions Precautions: None      Mobility Bed Mobility Overal bed mobility: Independent                Transfers Overall transfer level: Independent Equipment used: None                  Balance Overall balance assessment: Independent                                         ADL either performed or assessed with clinical judgement   ADL Overall ADL's : Independent                                             Vision Baseline Vision/History: No visual deficits Patient Visual Report: No change from baseline Vision Assessment?: Yes Eye Alignment: Within Functional Limits Ocular Range of Motion: Within Functional Limits Alignment/Gaze Preference: Within Defined Limits Tracking/Visual Pursuits: Able to track stimulus in all quads without difficulty Visual Fields: No apparent deficits Additional Comments: Pt located items in hallway without diffiuclty.  Uses  cell phone without difficulty      Perception Perception Perception Tested?: Yes   Praxis Praxis Praxis tested?: Within functional limits    Pertinent Vitals/Pain Pain Assessment: No/denies pain     Hand Dominance Right   Extremity/Trunk Assessment Upper Extremity Assessment Upper Extremity Assessment: Overall WFL for tasks assessed(5/5)   Lower Extremity Assessment Lower Extremity Assessment: Overall WFL for tasks assessed   Cervical / Trunk Assessment Cervical / Trunk Assessment: Normal   Communication Communication Communication: Prefers language other than English(Pt understands English. but doesn't speak well . )   Cognition Arousal/Alertness: Awake/alert Behavior During Therapy: WFL for tasks assessed/performed Overall Cognitive Status: Within Functional Limits for tasks assessed                                     General Comments  able to retrieve items from floor, reach overhead, turn, negotiate flight of steps independently.  Pt reports what sounds like proprioceptive deficit yesterday with the onset of nunmbess of Lt LE.  He reports it has cleared.  Instructed he and daughter if that happens again, to absolutely  avoid ladders or climbing.  They both verbalized understanding     Exercises     Shoulder Instructions      Home Living Family/patient expects to be discharged to:: Private residence Living Arrangements: Spouse/significant other;Children Available Help at Discharge: Family;Available PRN/intermittently Type of Home: House Home Access: Stairs to enter Entrance Stairs-Number of Steps: 1   Home Layout: Two level;Bed/bath upstairs Alternate Level Stairs-Number of Steps: full flight    Bathroom Shower/Tub: Tub/shower unit;Walk-in shower   Bathroom Toilet: Standard     Home Equipment: None          Prior Functioning/Environment Level of Independence: Independent        Comments: Pt works full time in Games [employer] Problem List: Impaired sensation      OT Treatment/Interventions:      OT Goals(Current goals can be found in the care plan section) Acute Rehab OT Goals Patient Stated Goal: to go home  OT Goal Formulation: All assessment and education complete, DC therapy  OT Frequency:     Barriers to D/C:            Co-evaluation              AM-PAC OT "6 Clicks" Daily Activity     Outcome Measure Help from another person eating meals?: None Help from another person taking care of personal grooming?: None Help from another person toileting, which includes using toliet, bedpan, or urinal?: None Help from another person bathing (including washing, rinsing, drying)?: None Help from another person to put on and taking off regular upper body clothing?: None Help from another person to put on and taking off regular lower body clothing?: None 6 Click Score: 24   End of Session Nurse Communication: Mobility status  Activity Tolerance: Patient tolerated treatment well Patient left: in bed;with call bell/phone within reach;with family/visitor present  OT Visit Diagnosis: Other symptoms and signs involving the nervous system (R29.898)                Time: 5003-7048 OT Time Calculation (min): 27 min Charges:  OT General Charges $OT Visit: 1 Visit OT Evaluation $OT Eval Low Complexity: 1 Low OT Treatments $Self Care/Home Management : 8-22 mins  Jeani Hawking, OTR/L Acute Rehabilitation Services Pager [phone] Office [phone]   Jeani Hawking M 03/20/2018, 9:53 AM

## 2018-03-20 NOTE — Progress Notes (Signed)
Carotid artery duplex completed. Refer to "CV Proc" under chart review to view preliminary results.  03/20/2018 8:35 AM Gertie Fey, MHA, RVT, RDCS, RDMS

## 2018-03-20 NOTE — Discharge Summary (Signed)
Physician Discharge Summary  Jordan Bradford NGE:[id number] DOB: [birth year] DOA: 03/19/2018  PCP: Marline Backbone, MD  Admit date: 03/19/2018 Discharge date: 03/20/2018  Admitted From: home Discharge disposition: home   Recommendations for Outpatient Follow-Up:   1. Referral made for neurology follow up for suspected TIA 2. Needs further titration of BP medications 3. Smoking cessation 4. ASA/plavix together for 21 days then ASA alone 5. Follow carotid duple: right with up to 59% stenosis   Discharge Diagnosis:   Principal Problem:   TIA (transient ischemic attack) Active Problems:   HTN (hypertension)   HLD (hyperlipidemia)   Smoking    Discharge Condition: Improved.  Diet recommendation: Low sodium, heart healthy.  Carbohydrate-modified  Wound care: None.  Code status: Full.   History of Present Illness:   Jordan Bradford is a 68 y.o. male with medical history significant of HTN, HLD, smoking, isnt really compliant with medication per daughter.  Patient presents to ED at [facility] with onset at ~10pm last night of L sided facial, LUE, and LLE numbness.  Symptoms persistent since that time.  No prior h/o Stroke/TIA.  No recent head injury.  Hospital Course by Problem:  TIA: -CT no acute abnormality.  - MRI showed no acute stroke.  However MRI showed severe right P2 segmental high-grade stenosis.  -Carotid Doppler right ICA 40 to 59% stenosis.  - 2D echo: - Left ventricle: The cavity size was normal. Systolic function was   normal. The estimated ejection fraction was in the range of 60%   to 65%. Wall motion was normal; there were no regional wall   motion abnormalities. Doppler parameters are consistent with   abnormal left ventricular relaxation (grade 1 diastolic   dysfunction). Severe focal basal septal hypertrophy and otherwise   moderate concentric hypertrophy. - Aortic valve: Mildly calcified annulus. Trileaflet. - Mitral valve: Mildly calcified annulus. -  Systemic veins: IVC is dilated with normal respiratory variation.   Estimated RAP: 8 mmHg. (ECHO WAS PENDING AT TIME OF D/C-- patient and family did not want to wait for results) -  Patient symptoms of left-sided numbness fit very well with right P2 high-grade stenosis.  - Total cholesterol 223, TG 480 and LDL not able to calculate due to high TG. --- statin started - A1c 6.4. - Smoking cessation education provided.   -NEURO: Recommend DAPT with aspirin 81 and Plavix 75 for 3 weeks and then aspirin alone.  Continue Lipitor 80 on discharge.  Risk factor modification and lifestyle changes.  HTN -has not been taking medications at home -start norvasc 10 today and in 2 days restart 100 cozaar-- will need further titration as an outpateint  Tobacco abuse -encourage cessation   Medical Consultants:   neuro   Discharge Exam:   Vitals:   03/20/18 1206 03/20/18 1500  BP: (!) 198/116 (!) 158/95  Pulse: 85 92  Resp: 18 16  Temp: 99 F (37.2 C) 98 F (36.7 C)  SpO2: 95% 100%   Vitals:   03/20/18 0740 03/20/18 0903 03/20/18 1206 03/20/18 1500  BP: (!) 175/98 (!) 153/94 (!) 198/116 (!) 158/95  Pulse: 88 80 85 92  Resp: 18  18 16   Temp: 98.5 F (36.9 C)  99 F (37.2 C) 98 F (36.7 C)  TempSrc: Oral  Oral Oral  SpO2: 96%  95% 100%  Weight:      Height:        General exam: Appears calm and comfortable.   The results of  significant diagnostics from this hospitalization (including imaging, microbiology, ancillary and laboratory) are listed below for reference.     Procedures and Diagnostic Studies:   Dg Chest 2 View  Result Date: 03/20/2018 CLINICAL DATA:  Transient ischemic attack. EXAM: CHEST - 2 VIEW COMPARISON:  None FINDINGS: Normal heart size. No pleural effusion. Pulmonary vascular congestion. No airspace consolidation. Bilateral calcified granulomas noted. Within the midthoracic spine there is an age indeterminate compression deformity with loss of approximately 15% of  the vertebral body height. IMPRESSION: 1. Pulmonary vascular congestion. 2. Chronic granulomatous disease 3. Mild, age-indeterminate midthoracic spine compression deformity. Electronically Signed   By: Signa Kell M.D.   On: 03/20/2018 08:28   Ct Head Wo Contrast  Result Date: 03/19/2018 CLINICAL DATA:  Numbness and tingling on the left which is chronic but worsening. EXAM: CT HEAD WITHOUT CONTRAST TECHNIQUE: Contiguous axial images were obtained from the base of the skull through the vertex without intravenous contrast. COMPARISON:  None. FINDINGS: Brain: The brain shows a normal appearance without evidence of malformation, atrophy, old or acute small or large vessel infarction, mass lesion, hemorrhage, hydrocephalus or extra-axial collection. Vascular: There is atherosclerotic calcification of the major vessels at the base of the brain. Skull: Normal.  No traumatic finding.  No focal bone lesion. Sinuses/Orbits: Sinuses are clear. Orbits appear normal. Mastoids are clear. Other: None significant IMPRESSION: Normal exam for age. Normal appearance of the brain itself. Some atherosclerotic calcification of the major vessels at the base of the brain. Electronically Signed   By: Paulina Fusi M.D.   On: 03/19/2018 17:17   Mr Brain Wo Contrast  Result Date: 03/20/2018 CLINICAL DATA:  Left-sided facial numbness with left upper and lower extremity numbness. EXAM: MRI HEAD WITHOUT CONTRAST MRA HEAD WITHOUT CONTRAST TECHNIQUE: Multiplanar, multiecho pulse sequences of the brain and surrounding structures were obtained without intravenous contrast. Angiographic images of the head were obtained using MRA technique without contrast. COMPARISON:  Head CT 03/19/2018 FINDINGS: MRI HEAD FINDINGS BRAIN: There is no acute infarct, acute hemorrhage or mass effect. The midline structures are normal. There are no old infarcts. The white matter signal is normal for the patient's age. The CSF spaces are normal for age, with no  hydrocephalus. Susceptibility-sensitive sequences show no chronic microhemorrhage or superficial siderosis. SKULL AND UPPER CERVICAL SPINE: The visualized skull base, calvarium, upper cervical spine and extracranial soft tissues are normal. SINUSES/ORBITS: No fluid levels or advanced mucosal thickening. No mastoid or middle ear effusion. The orbits are normal. MRA HEAD FINDINGS POSTERIOR CIRCULATION: --Basilar artery: There is multifocal mild-to-moderate stenosis of the distal vertebral arteries and proximal basilar artery. --Posterior cerebral arteries: Severe proximal stenosis of the right P2 segment. Normal left. --Superior cerebellar arteries: Normal. --Inferior cerebellar arteries: Normal anterior and posterior inferior cerebellar arteries. ANTERIOR CIRCULATION: --Intracranial internal carotid arteries: Normal. --Anterior cerebral arteries: Normal. Both A1 segments are present. Patent anterior communicating artery. --Middle cerebral arteries: Normal. --Posterior communicating arteries: Absent bilaterally IMPRESSION: 1. Normal brain. 2. 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 Electronically Signed   By: Deatra Robinson M.D.   On: 03/20/2018 02:40   Mr Maxine Glenn Head Wo Contrast  Result Date: 03/20/2018 CLINICAL DATA:  Left-sided facial numbness with left upper and lower extremity numbness. EXAM: MRI HEAD WITHOUT CONTRAST MRA HEAD WITHOUT CONTRAST TECHNIQUE: Multiplanar, multiecho pulse sequences of the brain and surrounding structures were obtained without intravenous contrast. Angiographic  images of the head were obtained using MRA technique without contrast. COMPARISON:  Head CT 03/19/2018 FINDINGS: MRI HEAD FINDINGS BRAIN: There is no acute infarct, acute hemorrhage or mass effect. The midline structures are normal. There are no old infarcts. The  white matter signal is normal for the patient's age. The CSF spaces are normal for age, with no hydrocephalus. Susceptibility-sensitive sequences show no chronic microhemorrhage or superficial siderosis. SKULL AND UPPER CERVICAL SPINE: The visualized skull base, calvarium, upper cervical spine and extracranial soft tissues are normal. SINUSES/ORBITS: No fluid levels or advanced mucosal thickening. No mastoid or middle ear effusion. The orbits are normal. MRA HEAD FINDINGS POSTERIOR CIRCULATION: --Basilar artery: There is multifocal mild-to-moderate stenosis of the distal vertebral arteries and proximal basilar artery. --Posterior cerebral arteries: Severe proximal stenosis of the right P2 segment. Normal left. --Superior cerebellar arteries: Normal. --Inferior cerebellar arteries: Normal anterior and posterior inferior cerebellar arteries. ANTERIOR CIRCULATION: --Intracranial internal carotid arteries: Normal. --Anterior cerebral arteries: Normal. Both A1 segments are present. Patent anterior communicating artery. --Middle cerebral arteries: Normal. --Posterior communicating arteries: Absent bilaterally IMPRESSION: 1. Normal brain. 2. 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 Electronically Signed   By: Deatra Robinson M.D.   On: 03/20/2018 02:40   Vas US Carotid (at [facility])  Result Date: 03/20/2018 Carotid Arterial Duplex Study Indications:  TIA. Risk Factors: Hypertension, hyperlipidemia. Performing Technologist: Gertie Fey MHA, RDMS, RVT, RDCS  Examination Guidelines: A complete evaluation includes B-mode imaging, spectral Doppler, color Doppler, and power Doppler as needed of all accessible portions of each vessel. Bilateral testing is considered an integral part of a complete examination. Limited examinations for reoccurring  indications may be performed as noted.  Right Carotid Findings: +----------+--------+--------+--------+--------------------------+--------+           PSV cm/sEDV cm/sStenosisDescribe                  Comments +----------+--------+--------+--------+--------------------------+--------+ CCA Prox  62      8               heterogenous and smooth            +----------+--------+--------+--------+--------------------------+--------+ CCA Distal25      6               smooth and heterogenous            +----------+--------+--------+--------+--------------------------+--------+ ICA Prox  125     46              smooth and hypoechoic              +----------+--------+--------+--------+--------------------------+--------+ ICA Distal132     31                                                 +----------+--------+--------+--------+--------------------------+--------+ ECA       69                      irregular and heterogenous         +----------+--------+--------+--------+--------------------------+--------+ +----------+--------+-------+----------------+-------------------+           PSV cm/sEDV cmsDescribe        Arm Pressure (mmHG) +----------+--------+-------+----------------+-------------------+ [id number]             Multiphasic, WNL                    +----------+--------+-------+----------------+-------------------+ +---------+--------+--+--------+--+---------+  VertebralPSV cm/s52EDV cm/s17Antegrade +---------+--------+--+--------+--+---------+  Left Carotid Findings: +----------+--------+--------+--------+-----------------------+--------+           PSV cm/sEDV cm/sStenosisDescribe               Comments +----------+--------+--------+--------+-----------------------+--------+ CCA Prox  101     23              smooth and heterogenous         +----------+--------+--------+--------+-----------------------+--------+ CCA Distal65      12                                               +----------+--------+--------+--------+-----------------------+--------+ ICA Prox  65      13                                              +----------+--------+--------+--------+-----------------------+--------+ ICA Distal93      25                                              +----------+--------+--------+--------+-----------------------+--------+ ECA       183     23                                              +----------+--------+--------+--------+-----------------------+--------+ +----------+--------+--------+----------------+-------------------+ SubclavianPSV cm/sEDV cm/sDescribe        Arm Pressure (mmHG) +----------+--------+--------+----------------+-------------------+           110             Multiphasic, WNL                    +----------+--------+--------+----------------+-------------------+ +---------+--------+--+--------+-+---------+ VertebralPSV cm/s35EDV cm/s8Antegrade +---------+--------+--+--------+-+---------+  Summary: Right Carotid: Velocities in the right ICA are consistent with a 40-59%                stenosis. Left Carotid: Velocities in the left ICA are consistent with a 1-39% stenosis. Vertebrals:  Bilateral vertebral arteries demonstrate antegrade flow. Subclavians: Normal flow hemodynamics were seen in bilateral subclavian              arteries. *See table(s) above for measurements and observations.  Electronically signed by Lemar LivingsBrandon Cain MD on 03/20/2018 at 1:30:12 PM.    Final      Labs:   Basic Metabolic Panel: Recent Labs  Lab 03/19/18 1709  NA 141  K 4.9  CL 107  CO2 28  GLUCOSE 127*  BUN 13  CREATININE 0.90  CALCIUM 9.0   GFR Estimated Creatinine Clearance: 75.2 mL/min (by C-G formula based on SCr of 0.9 mg/dL). Liver Function Tests: Recent Labs  Lab 03/19/18 1709  AST 24  ALT 32  ALKPHOS 80  BILITOT 0.4  PROT 7.4  ALBUMIN 4.3   No results for input(s): LIPASE, AMYLASE in  the last 168 hours. No results for input(s): AMMONIA in the last 168 hours. Coagulation profile Recent Labs  Lab 03/19/18 1709  INR 0.93    CBC: Recent Labs  Lab 03/19/18 1709  WBC 7.5  NEUTROABS 4.0  HGB 15.9  HCT 48.9  MCV 96.8  PLT 241   Cardiac Enzymes: Recent Labs  Lab 03/19/18 1709  TROPONINI <0.03   BNP: Invalid input(s): POCBNP CBG: No results for input(s): GLUCAP in the last 168 hours. D-Dimer No results for input(s): DDIMER in the last 72 hours. Hgb A1c Recent Labs    03/20/18 0614  HGBA1C 6.4*   Lipid Profile Recent Labs    03/20/18 0614  CHOL 223*  HDL 32*  LDLCALC UNABLE TO CALCULATE IF TRIGLYCERIDE OVER 400 mg/dL  TRIG [id number]*  CHOLHDL 7.0   Thyroid function studies No results for input(s): TSH, T4TOTAL, T3FREE, THYROIDAB in the last 72 hours.  Invalid input(s): FREET3 Anemia work up No results for input(s): VITAMINB12, FOLATE, FERRITIN, TIBC, IRON, RETICCTPCT in the last 72 hours. Microbiology No results found for this or any previous visit (from the past 240 hour(s)).   Discharge Instructions:   Discharge Instructions    Ambulatory referral to Neurology   Complete by:  As directed    Follow up with stroke clinic NP (Jessica Vanschaick or Darrol Angelarolyn Martin, if both not available, consider Manson AllanSethi, Penumali, or Ahern) [facility]. Thanks.   Diet - low sodium heart healthy   Complete by:  As directed    Discharge instructions   Complete by:  As directed    Stop smoking You will take ASA/plavix together for 3 weeks and then just the ASA after that   Increase activity slowly   Complete by:  As directed      Allergies as of 03/20/2018   No Known Allergies     Medication List    TAKE these medications   amLODipine 10 MG tablet Commonly known as:  NORVASC Take 1 tablet (10 mg total) by mouth daily. Start taking on:  March 21, 2018   aspirin 81 MG EC tablet Take 1 tablet (81 mg total) by mouth daily. Start taking on:   March 21, 2018   atorvastatin 80 MG tablet Commonly known as:  LIPITOR Take 1 tablet (80 mg total) by mouth daily at 6 PM.   clopidogrel 75 MG tablet Commonly known as:  PLAVIX Take 1 tablet (75 mg total) by mouth daily. Start taking on:  March 21, 2018   losartan 100 MG tablet Commonly known as:  COZAAR Take 1 tablet (100 mg total) by mouth daily. Start taking on:  March 22, 2018   nicotine 21 mg/24hr patch Commonly known as:  NICODERM CQ - dosed in mg/24 hours Place 1 patch (21 mg total) onto the skin daily.      Follow-up Information    [facility]. Schedule an appointment as soon as possible for a visit in 4 week(s).   Specialty:  Neurology Contact information: [address] [place] [id number] [phone]           Time coordinating discharge: 25 min  Signed:  Joseph ArtJessica U Vann DO  [facility] 03/20/2018, 5:18 PM

## 2018-03-20 NOTE — Progress Notes (Signed)
  Echocardiogram 2D Echocardiogram has been performed.  Jordan Bradford 03/20/2018, 3:52 PM

## 2018-05-06 ENCOUNTER — Other Ambulatory Visit: Payer: Self-pay

## 2018-05-06 ENCOUNTER — Encounter: Payer: Self-pay | Admitting: Adult Health

## 2018-05-06 ENCOUNTER — Ambulatory Visit (INDEPENDENT_AMBULATORY_CARE_PROVIDER_SITE_OTHER): Payer: Medicare Other | Admitting: Adult Health

## 2018-05-06 VITALS — BP 127/78 | HR 79 | Ht 66.0 in | Wt 160.0 lb

## 2018-05-06 DIAGNOSIS — I10 Essential (primary) hypertension: Secondary | ICD-10-CM | POA: Diagnosis not present

## 2018-05-06 DIAGNOSIS — Z87891 Personal history of nicotine dependence: Secondary | ICD-10-CM

## 2018-05-06 DIAGNOSIS — G459 Transient cerebral ischemic attack, unspecified: Secondary | ICD-10-CM | POA: Diagnosis not present

## 2018-05-06 DIAGNOSIS — E785 Hyperlipidemia, unspecified: Secondary | ICD-10-CM | POA: Diagnosis not present

## 2018-05-06 MED ORDER — ATORVASTATIN CALCIUM 80 MG PO TABS: 80.0000 mg | ORAL_TABLET | Freq: Every day | ORAL | 2 refills | Status: AC

## 2018-05-06 MED ORDER — AMLODIPINE BESYLATE 10 MG PO TABS: 10.0000 mg | ORAL_TABLET | Freq: Every day | ORAL | 0 refills | Status: AC

## 2018-05-06 MED ORDER — LOSARTAN POTASSIUM 100 MG PO TABS: 100.0000 mg | ORAL_TABLET | Freq: Every day | ORAL | 0 refills | Status: AC

## 2018-05-06 MED ORDER — ASPIRIN 81 MG PO TBEC: 81.0000 mg | DELAYED_RELEASE_TABLET | Freq: Every day | ORAL | 0 refills | Status: AC

## 2018-05-06 NOTE — Progress Notes (Signed)
I agree with the above plan 

## 2018-05-06 NOTE — Progress Notes (Signed)
[facility] [address] [place]. [place] [address] [phone]       OFFICE FOLLOW UP NOTE  Mr. Jordan Bradford Date of Birth:  [birth year] Medical Record Number:  [id number]   Reason for Referral:  hospital stroke follow up  CHIEF COMPLAINT:  Chief Complaint  Patient presents with  . Follow-up    Stroke follow up room 9 pt waiting on friend (KIL ) to interpet for him he speaks [place] its check for next appt waiver form was sign will be si    HPI: Jordan Bradford is being seen today for initial visit in the office for TIA on 03/19/18. History obtained from patient with assistance of his friend for interpretation and chart review. Reviewed all radiology images and labs personally.  Jordan Bradford is a 68 y.o. male with a history of hypertension, hyperlipidemia, ongoing tobacco use (50 pk yr hx), and medical noncompliance who presented to the [facility] in [facility] last night with a complaint of left-sided numbness.  He was seen in consultation by tele-neurology who recommended admission for further evaluation.  CT head reviewed was negative for acute infarct.  MRI reviewed and was also negative for acute infarct.  MRA showed a severe proximal right PCA P2 segment stenosis.  Carotid Dopplers revealed 40 to 59% stenosis of the left ICA.  Concerning for small thalamic stroke not seen on MRI versus TIA with stroke risk factors of HTN, HLD, tobacco use and noncompliance with medication management.  Per review of notes, he was noncompliant with his antihypertensives and statin.  Recommended DAPT for 3 weeks and aspirin alone.  LDL unable to calculate due to elevated triglycerides and initiated atorvastatin 80 mg daily.  Smoking cessation counseling provided.  Due to resolution of symptoms, he was discharged home in stable condition without therapy needs.  He is being seen today for hospital follow-up and is accompanied by a friend who assists with interpretation.  Initially after  discharge, he would experience intermittent left sided numbness with slurred speech but only last a few minutes and resolve. Over the past few weeks, he has not experienced any additional numbness.  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  He has completed 3 weeks DAPT and continues on aspirin alone without side effects or bruising.  Continues on atorvastatin without side effects of myalgias.  He hs not had repeat lipid panel since discharge.  Blood pressure today 127/78.  He does monitor at home and is typically 130s/80s.  He denies onging use of tobacco. He has returned back to all prior activies without complications. Denies new or worsening stroke/TIA symptoms.      ROS:   14 system review of systems performed and negative with exception of see HPI  PMH:  Past Medical History:  Diagnosis Date  . HLD (hyperlipidemia)   . Hypertension   . Stroke [facility])     PSH: History reviewed. No pertinent surgical history.  Social History:  Social History   Socioeconomic History  . Marital status: Married    Spouse name: Not on file  . Number of children: Not on file  . Years of education: Not on file  . Highest education level: Not on file  Occupational History  . Not on file  Social Needs  . Financial resource strain: Not on file  . Food insecurity:    Worry: Not on file    Inability: Not on  file  . Transportation needs:    Medical: Not on file    Non-medical: Not on file  Tobacco Use  . Smoking status: Former Games developer  . Smokeless tobacco: Never Used  Substance and Sexual Activity  . Alcohol use: Never    Frequency: Never  . Drug use: Never  . Sexual activity: Not on file  Lifestyle  . Physical activity:    Days per week: Not on file    Minutes per session: Not on file  . Stress: Not on file  Relationships  . Social connections:    Talks on phone: Not on file    Gets together: Not on file     Attends religious service: Not on file    Active member of club or organization: Not on file    Attends meetings of clubs or organizations: Not on file    Relationship status: Not on file  . Intimate partner violence:    Fear of current or ex partner: Not on file    Emotionally abused: Not on file    Physically abused: Not on file    Forced sexual activity: Not on file  Other Topics Concern  . Not on file  Social History Narrative  . Not on file    Family History:  Family History  Problem Relation Age of Onset  . Stroke Neg Hx     Medications:   No current outpatient medications on file prior to visit.   No current facility-administered medications on file prior to visit.     Allergies:  No Known Allergies   Physical Exam  Vitals:   05/06/18 1310  BP: 127/78  Pulse: 79  Weight: 160 lb (72.6 kg)  Height:  (1.676 m)   Body mass index is 25.82 kg/m. No exam data present  No flowsheet data found.   General: well developed, well nourished, pleasant middle aged [place] male, seated, in no evident distress Head: head normocephalic and atraumatic.   Neck: supple with no carotid or supraclavicular bruits Cardiovascular: regular rate and rhythm, no murmurs Musculoskeletal: no deformity Skin:  no rash/petichiae Vascular:  Normal pulses all extremities  Neurologic Exam Mental Status: Awake and fully alert. Oriented to place and time. Recent and remote memory intact. Attention span, concentration and fund of knowledge appropriate. Mood and affect appropriate.  Cranial Nerves: Fundoscopic exam reveals sharp disc margins. Pupils equal, briskly reactive to light. Extraocular movements full without nystagmus. Visual fields full to confrontation. Hearing intact. Facial sensation intact. Face, tongue, palate moves normally and symmetrically.  Motor: Normal bulk and tone. Normal strength in all tested extremity muscles. Sensory.: intact to touch , pinprick , position and  vibratory sensation.  Coordination: Rapid alternating movements normal in all extremities. Finger-to-nose and heel-to-shin performed accurately bilaterally. Gait and Station: Arises from chair without difficulty. Stance is normal. Gait demonstrates normal stride length and balance. Able to heel, toe and tandem walk without difficulty.  Reflexes: 1+ and symmetric. Toes downgoing.    NIHSS  0 Modified Rankin  0    Diagnostic Data (Labs, Imaging, Testing)  Ct Head Wo Contrast 03/19/2018 IMPRESSION:  Normal exam for age. Normal appearance of the brain itself. Some atherosclerotic calcification of the major vessels at the base of the brain.   Mr Brain wo contrast Mr Jordan Bradford Wo Contrast 03/20/2018 IMPRESSION:  1. Normal brain.  2. Multifocal atherosclerotic stenosis within the posterior circulation, predominantly involving the distal vertebral arteries and proximal basilar artery, with severe proximal right PCA  P2 segment stenosis.   Vas US Carotid 03/20/2018 Summary:  Right Carotid: Velocities in the right ICA are consistent with a 40-59% stenosis.  Left Carotid: Velocities in the left ICA are consistent with a 1-39% stenosis. Vertebrals:   Bilateral vertebral arteries demonstrate antegrade flow.  Subclavians: Normal flow hemodynamics were seen in bilateral subclavian arteries.  Preliminary    Echocardiogram 03/20/18 Study Conclusions  - Left ventricle: The cavity size was normal. Systolic function was   normal. The estimated ejection fraction was in the range of 60%   to 65%. Wall motion was normal; there were no regional wall   motion abnormalities. Doppler parameters are consistent with   abnormal left ventricular relaxation (grade 1 diastolic   dysfunction). Severe focal basal septal hypertrophy and otherwise   moderate concentric hypertrophy. - Aortic valve: Mildly calcified annulus. Trileaflet. - Mitral valve: Mildly calcified annulus. - Systemic veins: IVC is dilated  with normal respiratory variation.   Estimated RAP: 8 mmHg.   ASSESSMENT: Jordan Bradford is a 68 y.o. year old male here with TIA vis right thalamic infarct not seen on imaging on 03/19/18 secondary to small vessel disease. Vascular risk factors include HLD, HTN and tobacco use. He is being seen today for hospital follow-up and overall has recovered well from a stroke standpoint without residual deficits or recently reoccurring of symptoms.   PLAN:  1. TIA vs thalamic infarct : Continue aspirin 81 mg daily  and lipitor  for secondary stroke prevention. Maintain strict control of hypertension with blood pressure goal below 130/90, diabetes with hemoglobin A1c goal below 6.5% and cholesterol with LDL cholesterol (bad cholesterol) goal below 70 mg/dL.  I also advised the patient to eat a healthy diet with plenty of whole grains, cereals, fruits and vegetables, exercise regularly with at least 30 minutes of continuous activity daily and maintain ideal body weight. 2. HTN: Advised to continue current treatment regimen.  Today's BP 127/78.  Advised to continue to monitor at home along with continued follow-up with PCP for management 3. HLD: Advised to continue current treatment regimen along with continued follow-up with PCP for future prescribing and monitoring of lipid panel.  We will check lipid panel today due to elevation of triglycerides with inability to calculate LDL 4. Tobacco use: Congratulated patient on complete tobacco cessation and highly encouraged continuation of avoiding use of cigarettes 5. Refills provided at today's appointment as he only has a couple more days left of current medications but did advise to schedule appointment with PCP for follow-up visit along with ongoing future medication refills   Follow up in 6 months or call earlier if needed   Greater than 50% of time during this 25 minute visit was spent on counseling, explanation of diagnosis of TIA versus stomach infarct not  seen on MRI, reviewing risk factor management of HTN, HLD and history of tobacco use, planning of further management along with potential future management, and discussion with patient and family answering all questions.    George Hugh, AGNP-BC  [facility] [address] [place], [place] [address]  Phone [phone] Fax [phone] Note: This document was prepared with digital dictation and possible smart phrase technology. Any transcriptional errors that result from this process are unintentional.

## 2018-05-06 NOTE — Patient Instructions (Addendum)
Continue aspirin 81 mg daily  and lipitor  for secondary stroke prevention  Continue to follow up with PCP regarding cholesterol and blood pressure management - refills provided but ensure you schedule appointment with PCP for ongoing refills   We will check cholesterol levels today to ensure decreasing levels with use of statin  continue to stay active and maintain a healthy diet  Continue to monitor blood pressure at home  Maintain strict control of hypertension with blood pressure goal below 130/90, diabetes with hemoglobin A1c goal below 6.5% and cholesterol with LDL cholesterol (bad cholesterol) goal below 70 mg/dL. I also advised the patient to eat a healthy diet with plenty of whole grains, cereals, fruits and vegetables, exercise regularly and maintain ideal body weight.  Followup in the future with me in 6 months or call earlier if needed       Thank you for coming to see [place] at [facility]. I hope we have been able to provide you high quality care today.  You may receive a patient satisfaction survey over the next few weeks. We would appreciate your feedback and comments so that we may continue to improve ourselves and the health of our patients.

## 2018-05-07 LAB — LIPID PANEL
Chol/HDL Ratio: 5.4 ratio — ABNORMAL HIGH (ref 0.0–5.0)
Cholesterol, Total: 161 mg/dL (ref 100–199)
HDL: 30 mg/dL — ABNORMAL LOW (ref >39)
LDL Calculated: 58 mg/dL (ref 0–99)
Triglycerides: 365 mg/dL — ABNORMAL HIGH (ref 0–149)
VLDL CHOLESTEROL CAL: 73 mg/dL — AB (ref 5–40)

## 2018-05-11 ENCOUNTER — Telehealth: Payer: Self-pay

## 2018-05-11 NOTE — Telephone Encounter (Signed)
Made in error

## 2018-05-11 NOTE — Telephone Encounter (Signed)
I called language line and spoke with [place] interpreter. Pt speaks english but wants medical information in [place]. I gave him lipid panel lab results. I gave Shanda Bumps NP above recommendations to repeat in 2 to 3 months with PCP, and to continue lipitor dosage. Also to avoid carbohydrates refined sugars, fruit juices and high fructose beverages, increase exercise. Pt verbalized understanding per interpreter line.

## 2018-05-11 NOTE — Telephone Encounter (Signed)
-----   Message from Jordan Hugh, NP sent at 05/10/2018  8:50 AM EDT ----- Please advise patient that his recent cholesterol panel did show satisfactory LDL at 58 along with decreasing triglyceride level at 365 from prior for 480.  Please encourage patient to avoid carbohydrates including refined sugars, fruit juices and high fructose beverages along with increasing exercise and weight loss.  It is recommended to repeat lipid panel in 2 to 3 months and if triglycerides continue to be elevated, additional medication management may need to be initiated.  He will continue on current statin dosage at this time.

## 2018-11-10 ENCOUNTER — Ambulatory Visit: Payer: Medicare Other | Admitting: Adult Health

## 2020-02-20 IMAGING — CR DG CHEST 2V
2 series · 2 of 2 positions shown · non-contrast
Comparison: None

CLINICAL DATA: Transient ischemic attack.

EXAM:
CHEST - 2 VIEW

[chest pa]
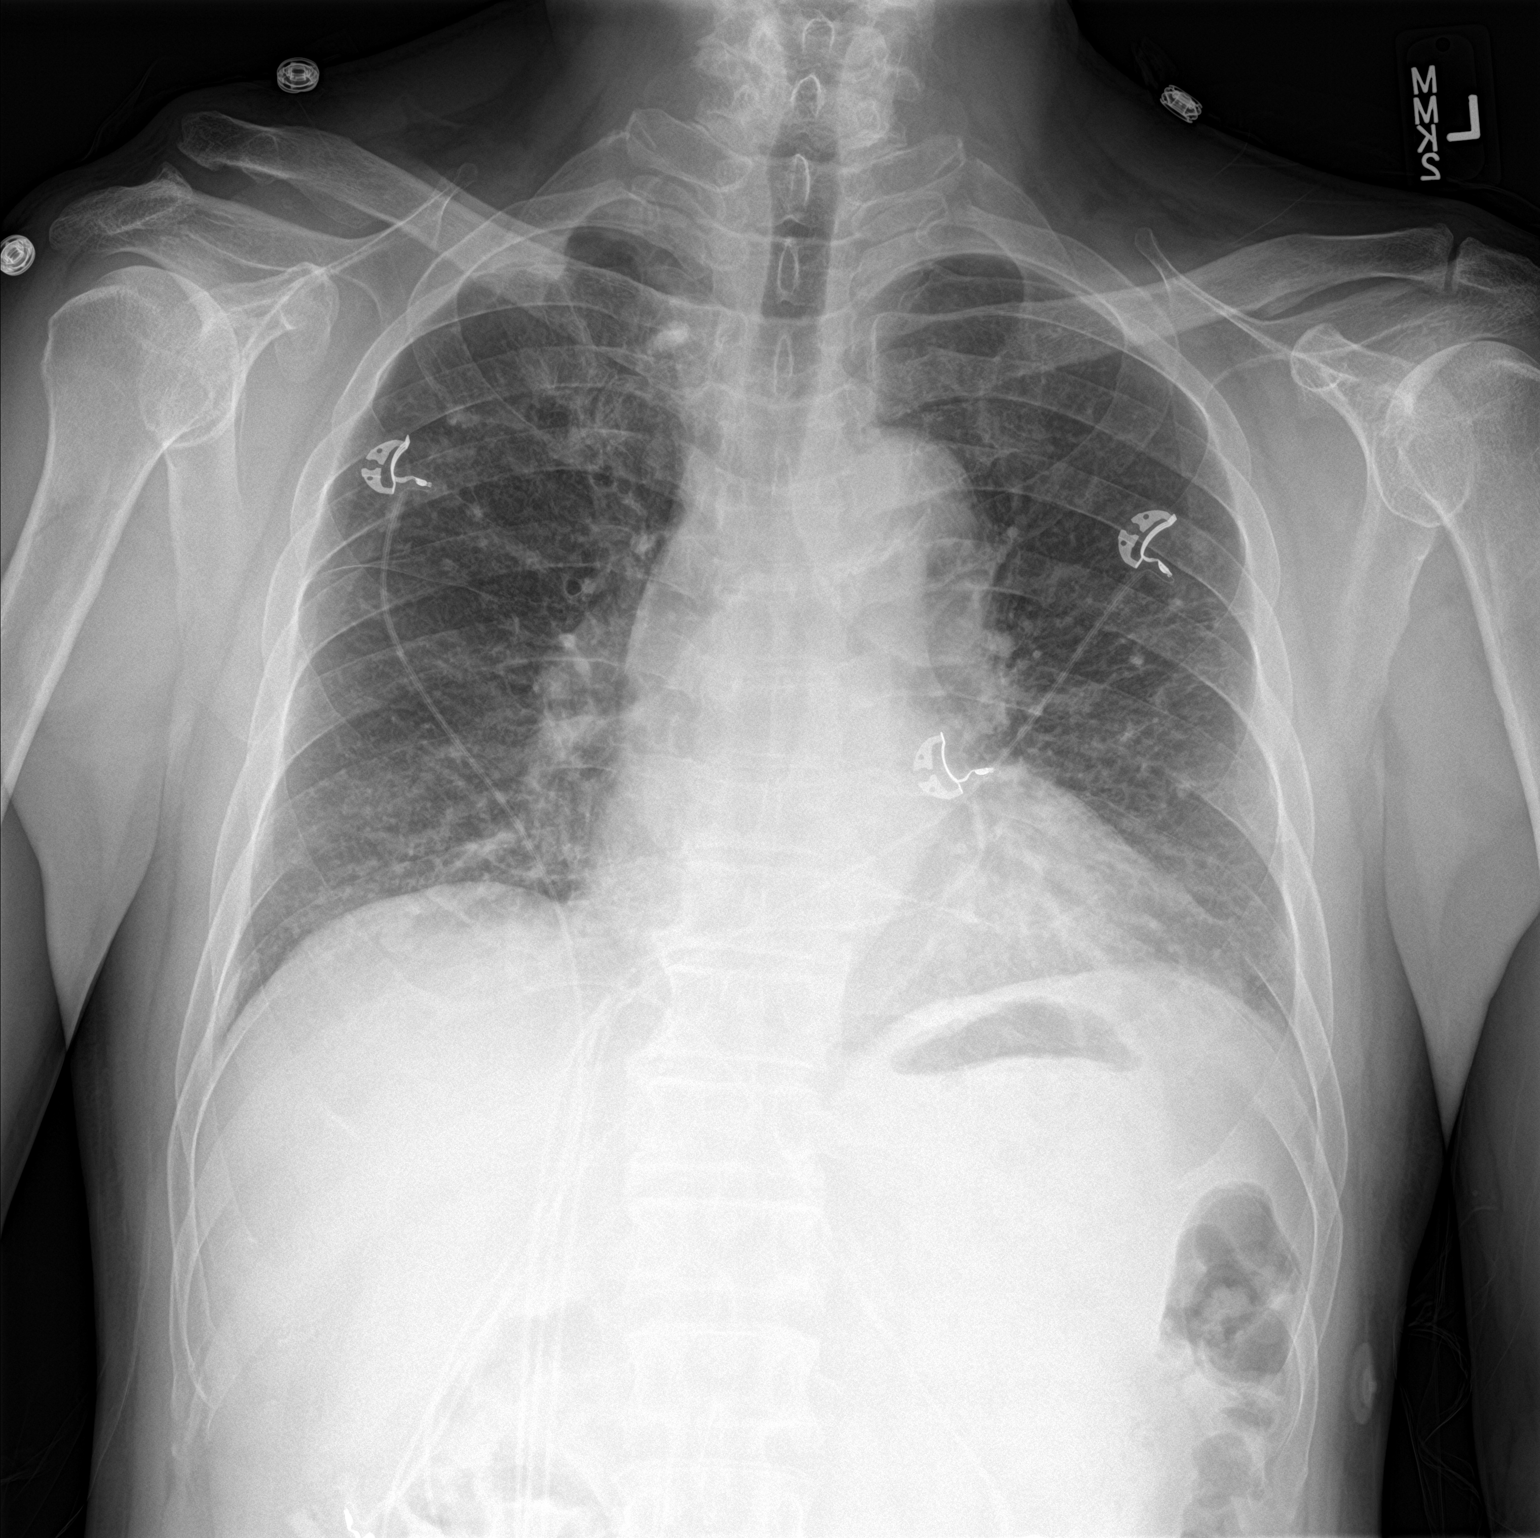

[chest lat]
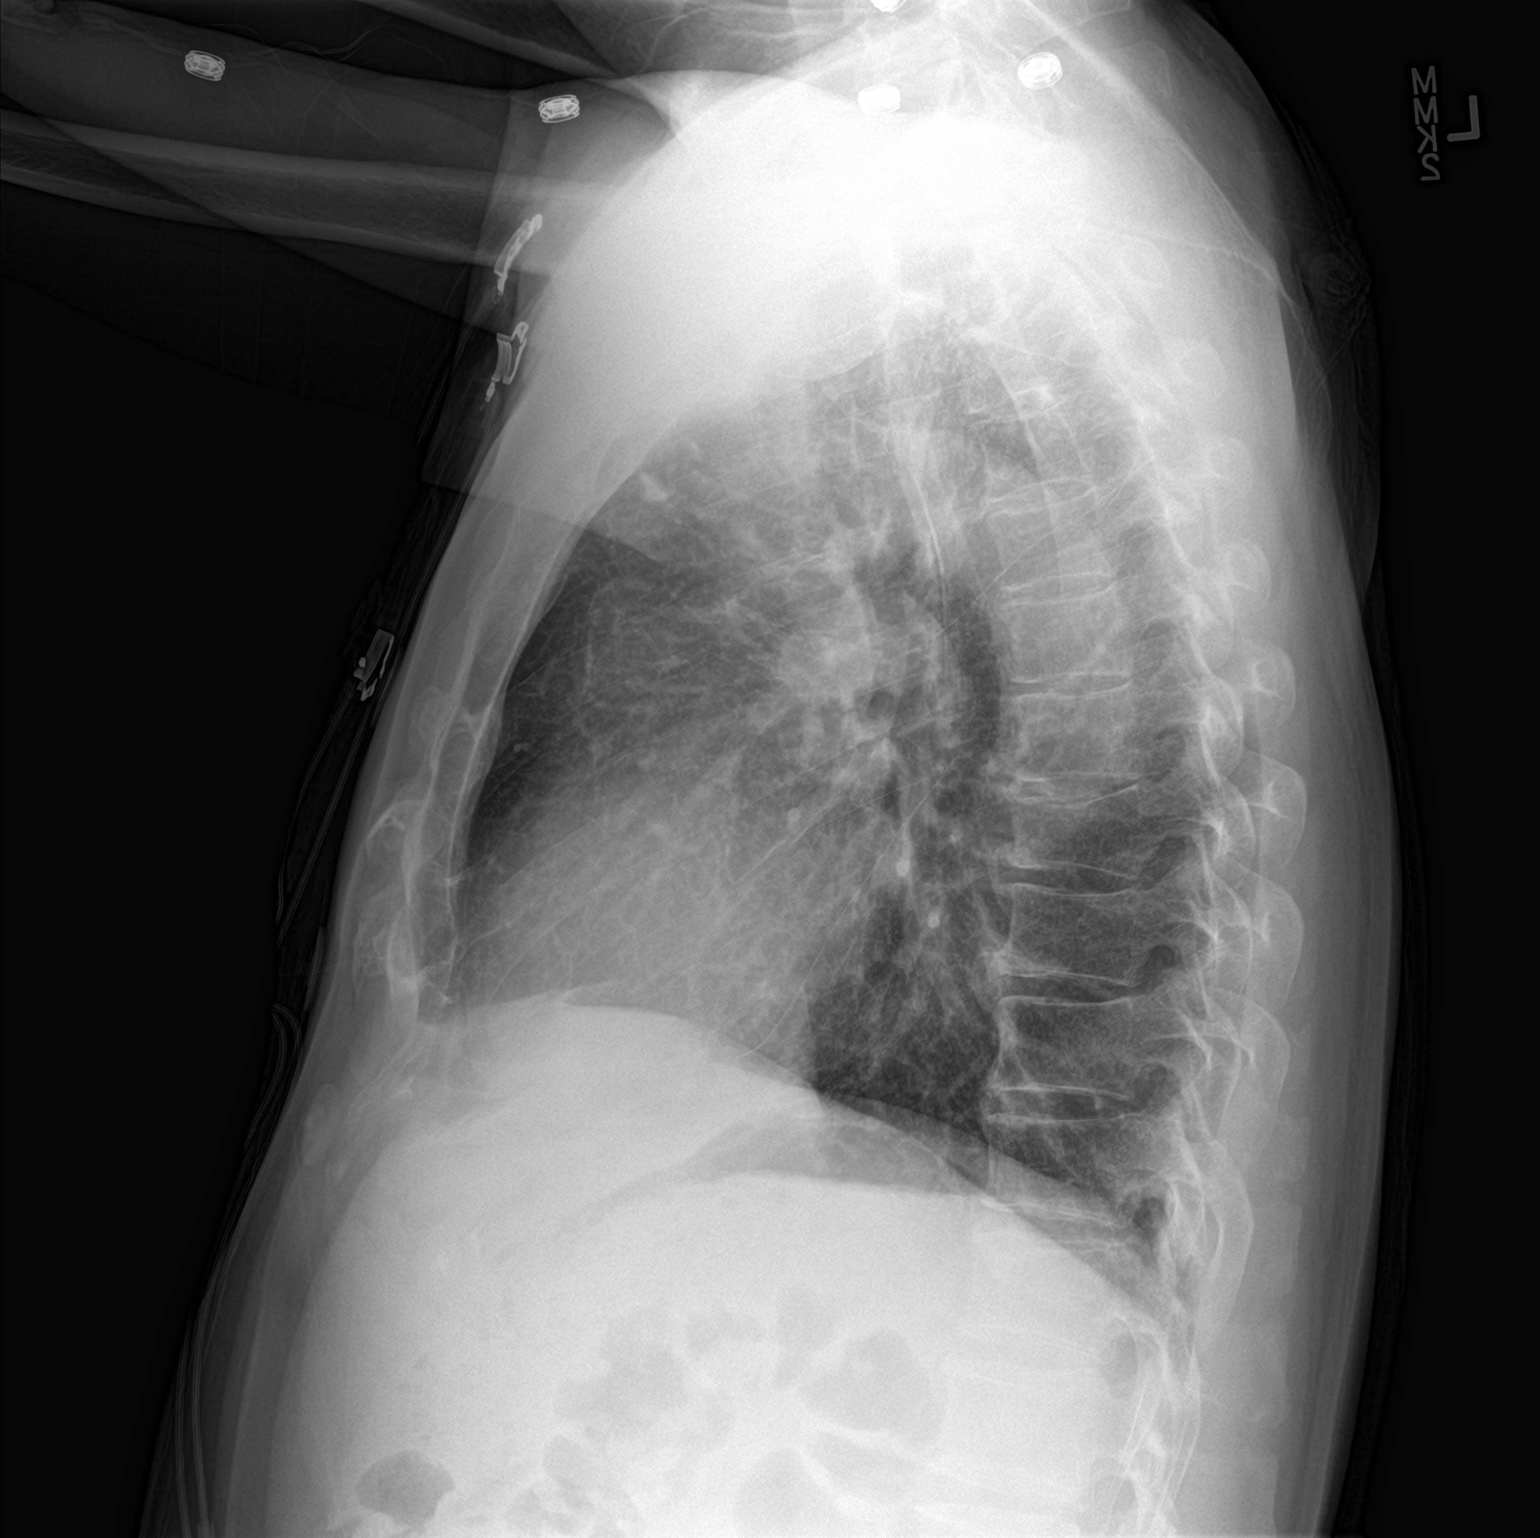

[2 of 2 positions shown; findings below may reference images not displayed]

FINDINGS: Normal heart size. No pleural effusion. Pulmonary vascular
congestion. No airspace consolidation. Bilateral calcified
granulomas noted. Within the midthoracic spine there is an age
indeterminate compression deformity with loss of approximately 15%
of the vertebral body height.
IMPRESSION: 1. Pulmonary vascular congestion.
2. Chronic granulomatous disease
3. Mild, age-indeterminate midthoracic spine compression deformity.
